# Patient Record
Sex: Male | Born: 1970 | Hispanic: Yes | Smoking: Never smoker
Health system: Southern US, Community
[De-identification: ages and names within clinical notes are randomized; demographics above are authoritative.]

---

## 2017-08-15 ENCOUNTER — Other Ambulatory Visit: Payer: Self-pay | Admitting: Family Medicine

## 2017-08-15 DIAGNOSIS — M542 Cervicalgia: Secondary | ICD-10-CM

## 2017-08-15 DIAGNOSIS — R6884 Jaw pain: Secondary | ICD-10-CM

## 2017-09-03 ENCOUNTER — Other Ambulatory Visit: Payer: Self-pay

## 2017-09-03 ENCOUNTER — Ambulatory Visit
Admission: RE | Admit: 2017-09-03 | Discharge: 2017-09-03 | Disposition: A | Discharge status: Home / Self Care | Payer: 59 | Source: Ambulatory Visit | Attending: Family Medicine | Admitting: Family Medicine

## 2017-09-03 DIAGNOSIS — R6884 Jaw pain: Secondary | ICD-10-CM

## 2017-10-30 ENCOUNTER — Other Ambulatory Visit: Payer: Self-pay | Admitting: Family Medicine

## 2017-10-30 ENCOUNTER — Ambulatory Visit
Admission: RE | Admit: 2017-10-30 | Discharge: 2017-10-30 | Disposition: A | Discharge status: Home / Self Care | Payer: 59 | Source: Ambulatory Visit | Attending: Family Medicine | Admitting: Family Medicine

## 2017-10-30 DIAGNOSIS — R1031 Right lower quadrant pain: Secondary | ICD-10-CM

## 2017-10-30 MED ORDER — IOPAMIDOL (ISOVUE-300) INJECTION 61%
100.0000 mL | Freq: Once | INTRAVENOUS | Status: AC | PRN
  Administered 2017-10-30: 100 mL via INTRAVENOUS

## 2019-05-19 IMAGING — CT CT ABD-PELV W/ CM
1 of 3 series · 13 of 32 positions shown, 19 images · IV contrast (APPLIED)
Comparison: None.

CLINICAL DATA: Right lower quadrant pain and pressure during
urination.

EXAM:
CT ABDOMEN AND PELVIS WITH CONTRAST
TECHNIQUE: Multidetector CT imaging of the abdomen and pelvis was performed
using the standard protocol following bolus administration of
intravenous contrast.
CONTRAST:  100mL [id number] IOPAMIDOL ([id number]) INJECTION 61%

[Series 2: abd/pelvis w/cm · axial · 0.77mm/px · z∈[-460,-40]mm · 13 of 98 slices shown, 19 images]
[im 7/98  soft-tissue]
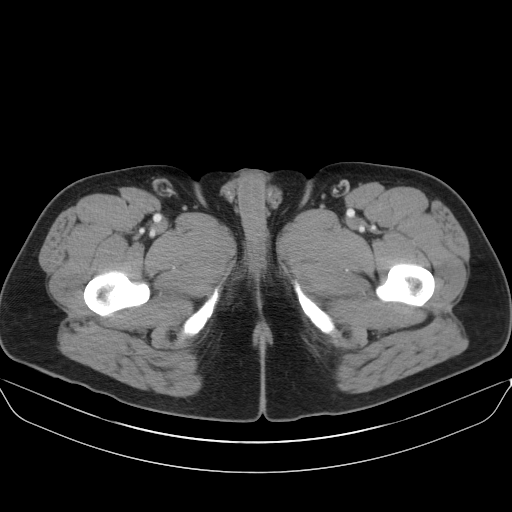
[im 7/98  bone]
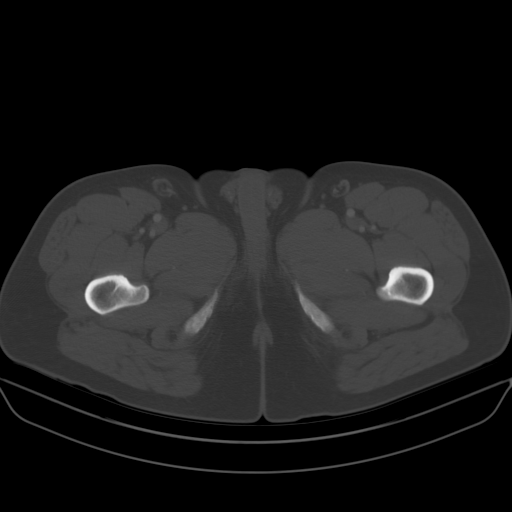
[im 14/98  soft-tissue]
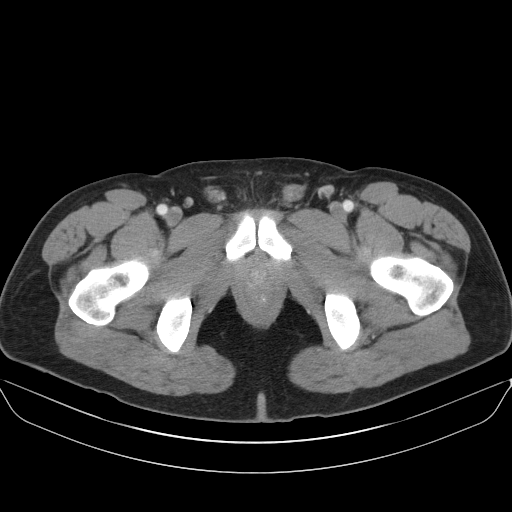
[im 21/98  soft-tissue]
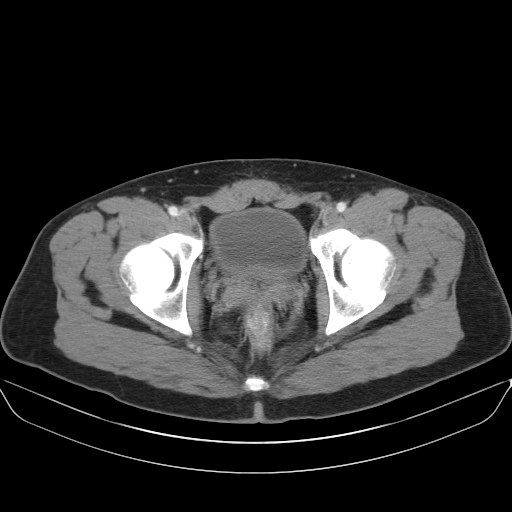
[im 28/98  soft-tissue]
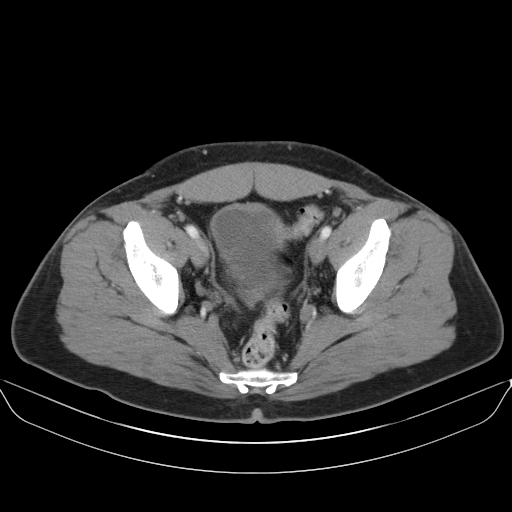
[im 35/98  soft-tissue]
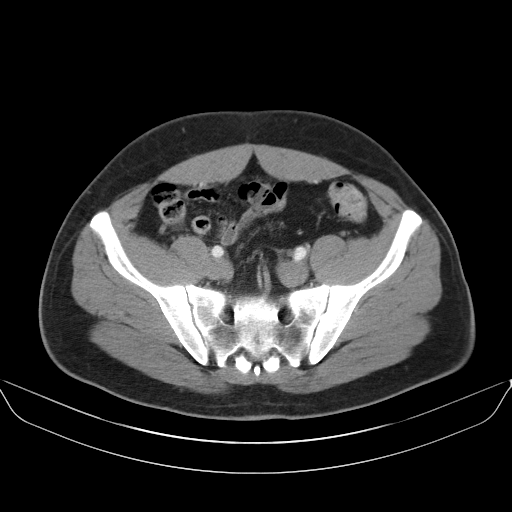
[im 42/98  soft-tissue]
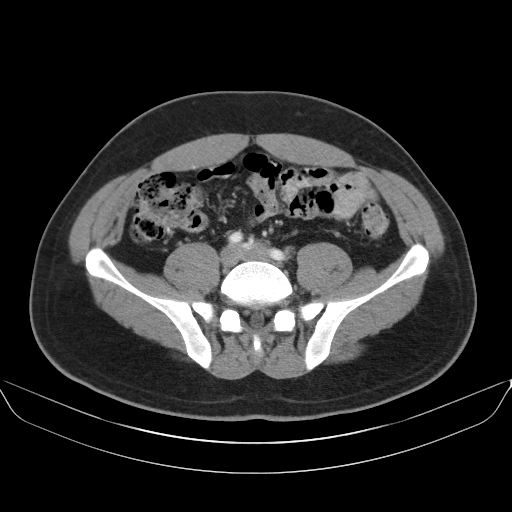
[im 49/98  soft-tissue]
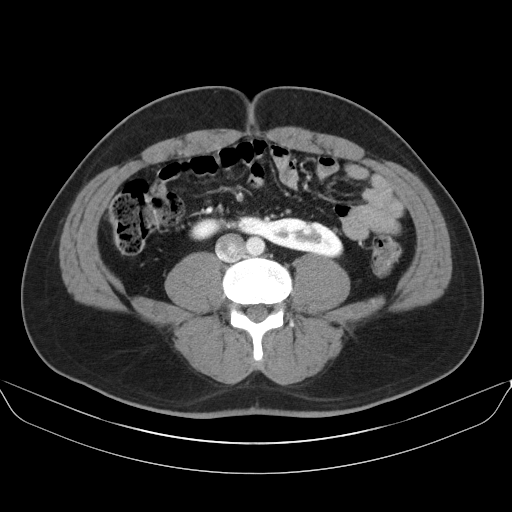
[im 56/98  soft-tissue]
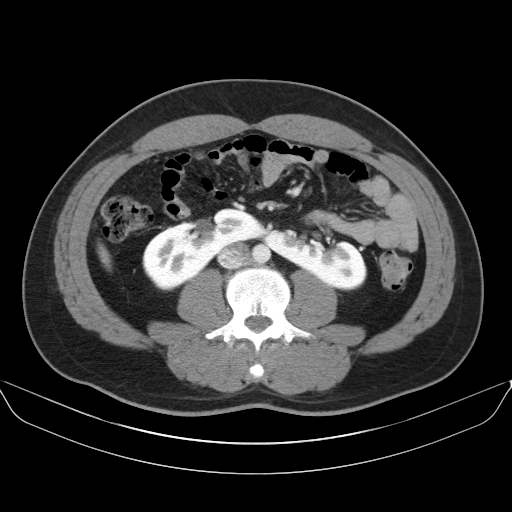
[im 63/98  soft-tissue]
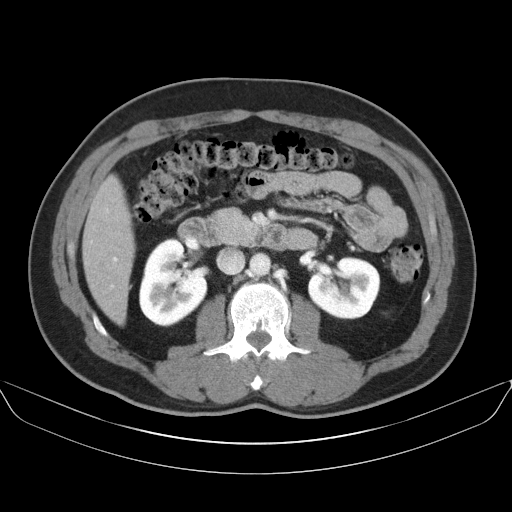
[im 63/98  bone]
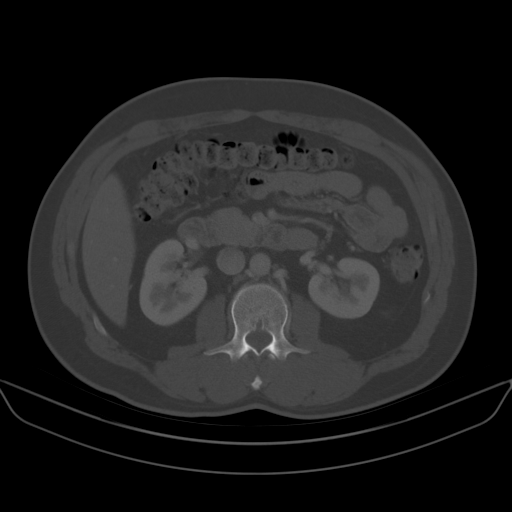
[im 70/98  soft-tissue]
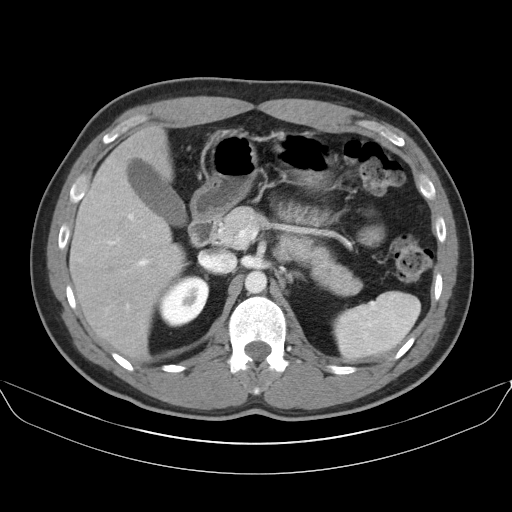
[im 70/98  lung]
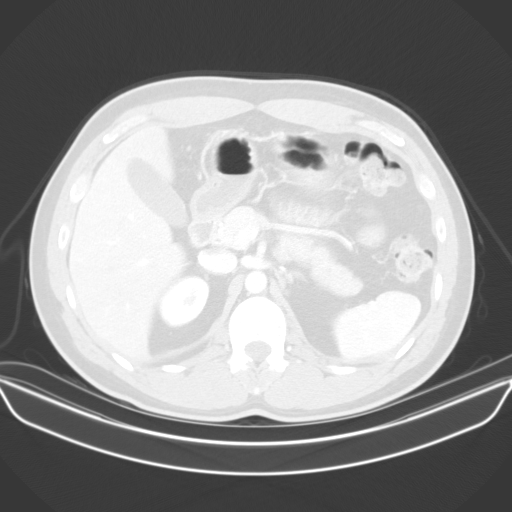
[im 77/98  soft-tissue]
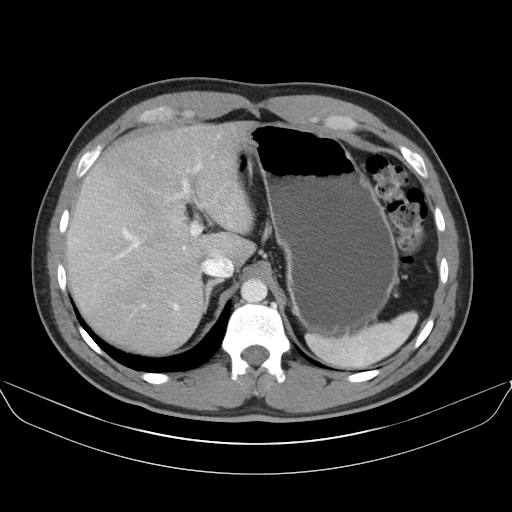
[im 77/98  lung]
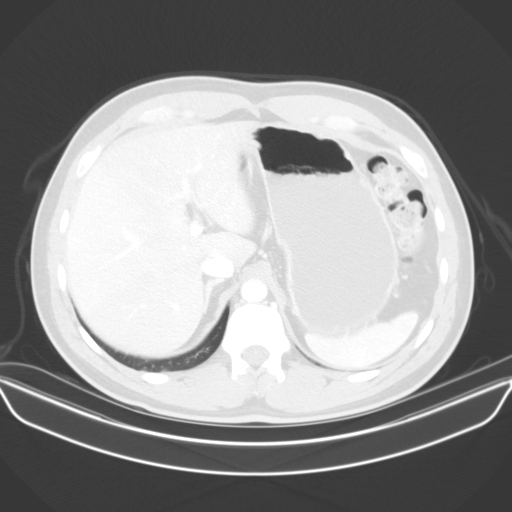
[im 84/98  soft-tissue]
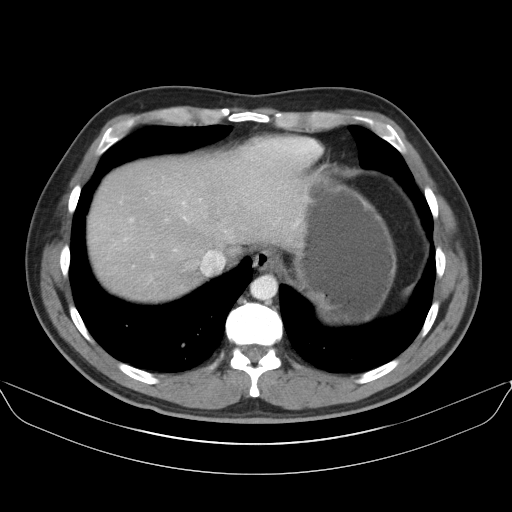
[im 84/98  lung]
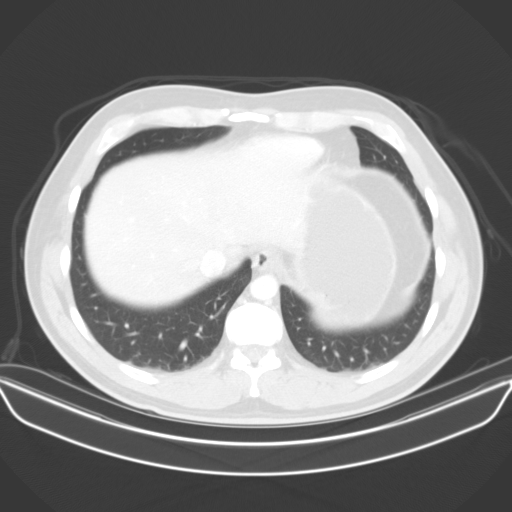
[im 91/98  soft-tissue]
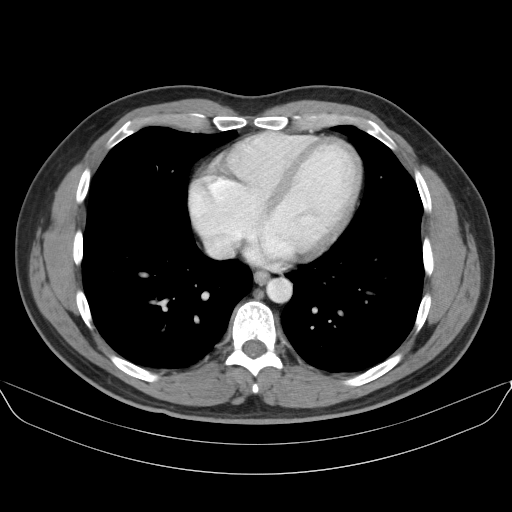
[im 91/98  lung]
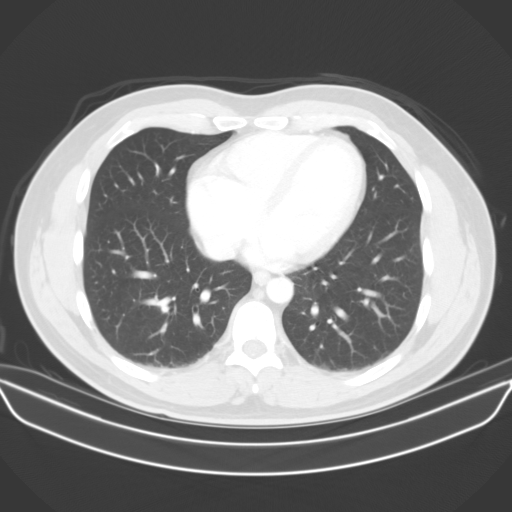

[13 of 32 positions shown; findings below may reference images not displayed]

FINDINGS: Lower chest: No acute abnormality.

Hepatobiliary: No focal liver abnormality is seen. No gallstones,
gallbladder wall thickening, or biliary dilatation.

Pancreas: Unremarkable. No pancreatic ductal dilatation or
surrounding inflammatory changes.

Spleen: Normal in size without focal abnormality.

Adrenals/Urinary Tract: Normal adrenal glands. Horseshoe kidney. No
hydronephrosis or renal masses. Normal appearance of the ureters.
Diffuse mucosal wall thickening of the urinary bladder.

Stomach/Bowel: Stomach is within normal limits. Appendix appears
normal. No evidence of bowel wall thickening, distention, or
inflammatory changes.

Vascular/Lymphatic: Mild aortic atherosclerosis. No enlarged
abdominal or pelvic lymph nodes.

Reproductive: Enlarged heterogeneous prostate gland which contains
coarse calcifications.

Other: No abdominal wall hernia or abnormality. No abdominopelvic
ascites.

Musculoskeletal: No acute or significant osseous findings.
IMPRESSION: Enlarged heterogeneous prostate gland which contains coarse
calcifications. Differential diagnosis includes prostatitis versus
prostate cancer.

Diffuse mucosal thickening of the urinary bladder, which may be
infectious/inflammatory or due to chronic outlet obstruction.

Horseshoe kidney without apparent complications.

## 2020-02-13 ENCOUNTER — Encounter (HOSPITAL_COMMUNITY): Payer: Self-pay | Admitting: *Deleted

## 2020-02-13 ENCOUNTER — Ambulatory Visit (HOSPITAL_COMMUNITY)
Admission: EM | Admit: 2020-02-13 | Discharge: 2020-02-13 | Disposition: A | Discharge status: Home / Self Care | Payer: HRSA Program | Attending: Family Medicine | Admitting: Family Medicine

## 2020-02-13 ENCOUNTER — Other Ambulatory Visit: Payer: Self-pay

## 2020-02-13 ENCOUNTER — Other Ambulatory Visit: Payer: Self-pay | Admitting: Nurse Practitioner

## 2020-02-13 ENCOUNTER — Telehealth (HOSPITAL_COMMUNITY): Payer: Self-pay

## 2020-02-13 DIAGNOSIS — U071 COVID-19: Secondary | ICD-10-CM

## 2020-02-13 DIAGNOSIS — R0602 Shortness of breath: Secondary | ICD-10-CM | POA: Diagnosis not present

## 2020-02-13 MED ORDER — ALBUTEROL SULFATE HFA 108 (90 BASE) MCG/ACT IN AERS
INHALATION_SPRAY | RESPIRATORY_TRACT | Status: AC
  Filled 2020-02-13: qty 6.7

## 2020-02-13 MED ORDER — ALBUTEROL SULFATE HFA 108 (90 BASE) MCG/ACT IN AERS
2.0000 | INHALATION_SPRAY | Freq: Once | RESPIRATORY_TRACT | Status: AC
Start: 2020-02-13 — End: 2020-02-13
  Administered 2020-02-13: 18:00:00 2 via RESPIRATORY_TRACT

## 2020-02-13 MED ORDER — PROMETHAZINE-DM 6.25-15 MG/5ML PO SYRP: 5.0000 mL | ORAL_SOLUTION | Freq: Four times a day (QID) | ORAL | 0 refills | Status: DC | PRN

## 2020-02-13 NOTE — Discharge Instructions (Signed)
Go directly to the ER if your symptoms worsen at any time 

## 2020-02-13 NOTE — Telephone Encounter (Signed)
Called to discuss with patient about Covid symptoms and the use of the monoclonal antibody infusion for those with mild to moderate Covid symptoms and at a high risk of hospitalization.     Pt appears to qualify for this infusion due to co-morbid conditions and/or a member of an at-risk group in accordance with the FDA Emergency Use Authorization.    Risk factors include: BMI over 25, previous smoker,    Symptom onset: 12/11, body aches, chills, loss taste and smell, chest pain, cough.    Tested positive for COVID 19: 12/17 will bring test results.    Discussed information regarding costs of monoclonal antibody treatment, given both CPT & REV codes, and encouraged patient to call their health insurance company to verify cost of treatment that pt will be financially responsible for.  Patient aware they will receive a call from APP for further information and to schedule appointment. All questions answered.   Gave patient information the address to clinic, [address] and phone number to call once patient arrives, [phone].

## 2020-02-13 NOTE — ED Triage Notes (Signed)
Pt reports he tested positive for COVID on dec. 24,2683. Pt went to a drive thur testing site [place] .HE reports productive cough and SHOB. Pt reports increased pain when coughing. His mucous is clear at this time.

## 2020-02-13 NOTE — Progress Notes (Signed)
I connected by phone with Ronald Rocha on 02/13/2020 at 4:26 PM to discuss the potential use of a new treatment for mild to moderate COVID-19 viral infection in non-hospitalized patients.  This patient is a 49 y.o. male that meets the FDA criteria for Emergency Use Authorization of COVID monoclonal antibody casirivimab/imdevimab, bamlanivimab/etesevimab, or sotrovimab.  Has a (+) direct SARS-CoV-2 viral test result  Has mild or moderate COVID-19   Is NOT hospitalized due to COVID-19  Is within 10 days of symptom onset  Has at least one of the high risk factor(s) for progression to severe COVID-19 and/or hospitalization as defined in EUA.  Specific high risk criteria : BMI > 25 and Other high risk medical condition per CDC:  smoker   I have spoken and communicated the following to the patient or parent/caregiver regarding COVID monoclonal antibody treatment:  1. FDA has authorized the emergency use for the treatment of mild to moderate COVID-19 in adults and pediatric patients with positive results of direct SARS-CoV-2 viral testing who are 69 years of age and older weighing at least 40 kg, and who are at high risk for progressing to severe COVID-19 and/or hospitalization.  2. The significant known and potential risks and benefits of COVID monoclonal antibody, and the extent to which such potential risks and benefits are unknown.  3. Information on available alternative treatments and the risks and benefits of those alternatives, including clinical trials.  4. Patients treated with COVID monoclonal antibody should continue to self-isolate and use infection control measures (e.g., wear mask, isolate, social distance, avoid sharing personal items, clean and disinfect "high touch" surfaces, and frequent handwashing) according to CDC guidelines.   5. The patient or parent/caregiver has the option to accept or refuse COVID monoclonal antibody treatment.  After reviewing this information  with the patient, the patient has agreed to receive one of the available covid 19 monoclonal antibodies and will be provided an appropriate fact sheet prior to infusion. Mayra Reel, NP 02/13/2020 4:26 PM

## 2020-02-14 NOTE — ED Provider Notes (Signed)
[facility]    CSN: [id number] Arrival date & time: 02/13/20  1723      History   Chief Complaint Chief Complaint  Patient presents with   Cough    HPI Ronald Rocha is a 49 y.o. male.   Patient presenting today with cough, chest soreness and intermittent episodes of SOB with coughing spells. States he started with mild sick sxs with runny nose, low grade fever several days ago, tested positive for COVID 02/08/20 at a drive up site. Denies sore throat, rash, abdominal pain, N/V/D, hx of smoking or pulmonary dz. So far has not been trying anything OTC for sxs.      History reviewed. No pertinent past medical history.  There are no problems to display for this patient.   History reviewed. No pertinent surgical history.     Home Medications    Prior to Admission medications   Medication Sig Start Date End Date Taking? Authorizing Provider  promethazine-dextromethorphan (PROMETHAZINE-DM) 6.25-15 MG/5ML syrup Take 5 mLs by mouth 4 (four) times daily as needed for cough. 02/13/20   Particia Nearing, PA-C    Family History History reviewed. No pertinent family history.  Social History Social History   Tobacco Use   Smoking status: Never Smoker   Smokeless tobacco: Never Used  Substance Use Topics   Alcohol use: Not Currently   Drug use: Not Currently     Allergies   Patient has no allergy information on record.   Review of Systems Review of Systems PER HPI    Physical Exam Triage Vital Signs ED Triage Vitals  Enc Vitals Group     BP 02/13/20 1749 135/80     Pulse Rate 02/13/20 1749 89     Resp 02/13/20 1749 20     Temp 02/13/20 1749 99.2 F (37.3 C)     Temp Source 02/13/20 1749 Oral     SpO2 02/13/20 1749 96 %     Weight --      Height 02/13/20 1751 5\' 5"  (1.651 m)     Head Circumference --      Peak Flow --      Pain Score 02/13/20 1750 7     Pain Loc --      Pain Edu? --      Excl. in GC? --    No data  found.  Updated Vital Signs BP 135/80 (BP Location: Right Arm)    Pulse 89    Temp 99.2 F (37.3 C) (Oral)    Resp 20    Ht 5\' 5"  (1.651 m)    SpO2 96%   Visual Acuity Right Eye Distance:   Left Eye Distance:   Bilateral Distance:    Right Eye Near:   Left Eye Near:    Bilateral Near:     Physical Exam Vitals and nursing note reviewed.  Constitutional:      Appearance: Normal appearance.  HENT:     Head: Atraumatic.     Right Ear: Tympanic membrane normal.     Left Ear: Tympanic membrane normal.     Nose: Rhinorrhea present.     Mouth/Throat:     Mouth: Mucous membranes are moist.     Pharynx: Oropharynx is clear. Posterior oropharyngeal erythema present.  Eyes:     Extraocular Movements: Extraocular movements intact.     Conjunctiva/sclera: Conjunctivae normal.  Cardiovascular:     Rate and Rhythm: Normal rate and regular rhythm.  Pulmonary:     Effort: Pulmonary  effort is normal. No respiratory distress.     Breath sounds: Normal breath sounds. No wheezing or rales.  Abdominal:     General: Bowel sounds are normal.     Palpations: Abdomen is soft.  Musculoskeletal:        General: Normal range of motion.     Cervical back: Normal range of motion and neck supple.  Skin:    General: Skin is warm and dry.     Findings: No rash.  Neurological:     General: No focal deficit present.     Mental Status: He is oriented to person, place, and time.     Motor: No weakness.  Psychiatric:        Mood and Affect: Mood normal.        Thought Content: Thought content normal.        Judgment: Judgment normal.      UC Treatments / Results  Labs (all labs ordered are listed, but only abnormal results are displayed) Labs Reviewed - No data to display  EKG   Radiology No results found.  Procedures Procedures (including critical care time)  Medications Ordered in UC Medications  albuterol (VENTOLIN HFA) 108 (90 Base) MCG/ACT inhaler 2 puff (2 puffs Inhalation Given  02/13/20 1825)    Initial Impression / Assessment and Plan / UC Course  I have reviewed the triage vital signs and the nursing notes.  Pertinent labs & imaging results that were available during my care of the patient were reviewed by me and considered in my medical decision making (see chart for details).     Afebrile today, O2 saturation at 96% on room air, speaking in full sentences and breathing comfortably during exam with lungs CTAB. Will treat supportively at this point with albuterol inhaler, phenergan DM, and supportive OTC medications and home care. Discussed strict ED precautions if worsening.   Final Clinical Impressions(s) / UC Diagnoses   Final diagnoses:  COVID-19  SOB (shortness of breath)     Discharge Instructions     Go directly to the ER if your symptoms worsen at any time    ED Prescriptions    Medication Sig Dispense Auth. Provider   promethazine-dextromethorphan (PROMETHAZINE-DM) 6.25-15 MG/5ML syrup Take 5 mLs by mouth 4 (four) times daily as needed for cough. 100 mL Particia Nearing, [place]     PDMP not reviewed this encounter.   Roosvelt Maser [place], [place] 02/14/20 [phone]

## 2020-02-15 ENCOUNTER — Ambulatory Visit (HOSPITAL_COMMUNITY)
Admission: RE | Admit: 2020-02-15 | Discharge: 2020-02-15 | Disposition: A | Discharge status: Home / Self Care | Payer: 59 | Source: Ambulatory Visit | Attending: Pulmonary Disease | Admitting: Pulmonary Disease

## 2020-02-15 DIAGNOSIS — U071 COVID-19: Secondary | ICD-10-CM | POA: Insufficient documentation

## 2020-02-15 MED ORDER — METHYLPREDNISOLONE SODIUM SUCC 125 MG IJ SOLR: 125.0000 mg | Freq: Once | INTRAMUSCULAR | Status: DC | PRN

## 2020-02-15 MED ORDER — DIPHENHYDRAMINE HCL 50 MG/ML IJ SOLN: 50.0000 mg | Freq: Once | INTRAMUSCULAR | Status: DC | PRN

## 2020-02-15 MED ORDER — FAMOTIDINE IN NACL 20-0.9 MG/50ML-% IV SOLN: 20.0000 mg | Freq: Once | INTRAVENOUS | Status: DC | PRN

## 2020-02-15 MED ORDER — ALBUTEROL SULFATE HFA 108 (90 BASE) MCG/ACT IN AERS: 2.0000 | INHALATION_SPRAY | Freq: Once | RESPIRATORY_TRACT | Status: DC | PRN

## 2020-02-15 MED ORDER — SODIUM CHLORIDE 0.9 % IV SOLN: Freq: Once | INTRAVENOUS | Status: AC

## 2020-02-15 MED ORDER — EPINEPHRINE 0.3 MG/0.3ML IJ SOAJ: 0.3000 mg | Freq: Once | INTRAMUSCULAR | Status: DC | PRN

## 2020-02-15 MED ORDER — SODIUM CHLORIDE 0.9 % IV SOLN: INTRAVENOUS | Status: DC | PRN

## 2020-02-15 NOTE — Progress Notes (Signed)
Patient reviewed Fact Sheet for Patients, Parents, and Caregivers for Emergency Use Authorization (EUA) of bamlanivimab and etesevimab for the Treatment of Coronavirus. Patient also reviewed and is agreeable to the estimated cost of treatment. Patient is agreeable to proceed.   

## 2020-02-15 NOTE — Discharge Instructions (Signed)
10 Things You Can Do to Manage Your COVID-19 Symptoms at Home If you have possible or confirmed COVID-19: 1. Stay home from work and school. And stay away from other public places. If you must go out, avoid using any kind of public transportation, ridesharing, or taxis. 2. Monitor your symptoms carefully. If your symptoms get worse, call your healthcare provider immediately. 3. Get rest and stay hydrated. 4. If you have a medical appointment, call the healthcare provider ahead of time and tell them that you have or may have COVID-19. 5. For medical emergencies, call 911 and notify the dispatch personnel that you have or may have COVID-19. 6. Cover your cough and sneezes with a tissue or use the inside of your elbow. 7. Wash your hands often with soap and water for at least 20 seconds or clean your hands with an alcohol-based hand sanitizer that contains at least 60% alcohol. 8. As much as possible, stay in a specific room and away from other people in your home. Also, you should use a separate bathroom, if available. If you need to be around other people in or outside of the home, wear a mask. 9. Avoid sharing personal items with other people in your household, like dishes, towels, and bedding. 10. Clean all surfaces that are touched often, like counters, tabletops, and doorknobs. Use household cleaning sprays or wipes according to the label instructions. cdc.gov/coronavirus 08/27/2018 This information is not intended to replace advice given to you by your health care provider. Make sure you discuss any questions you have with your health care provider. Document Revised: 01/29/2019 Document Reviewed: 01/29/2019 Elsevier Patient Education  2020 Elsevier Inc. What types of side effects do monoclonal antibody drugs cause?  Common side effects  In general, the more common side effects caused by monoclonal antibody drugs include: . Allergic reactions, such as hives or itching . Flu-like signs and  symptoms, including chills, fatigue, fever, and muscle aches and pains . Nausea, vomiting . Diarrhea . Skin rashes . Low blood pressure   The CDC is recommending patients who receive monoclonal antibody treatments wait at least 90 days before being vaccinated.  Currently, there are no data on the safety and efficacy of mRNA COVID-19 vaccines in persons who received monoclonal antibodies or convalescent plasma as part of COVID-19 treatment. Based on the estimated half-life of such therapies as well as evidence suggesting that reinfection is uncommon in the 90 days after initial infection, vaccination should be deferred for at least 90 days, as a precautionary measure until additional information becomes available, to avoid interference of the antibody treatment with vaccine-induced immune responses. If you have any questions or concerns after the infusion please call the Advanced Practice Provider on call at [phone]. This number is ONLY intended for your use regarding questions or concerns about the infusion post-treatment side-effects.  Please do not provide this number to others for use. For return to work notes please contact your primary care provider.   If someone you know is interested in receiving treatment please have them call the COVID hotline at [phone].   

## 2020-02-15 NOTE — Progress Notes (Signed)
  Diagnosis: COVID-19  Physician: Dr. Shan Levans  Procedure: Covid Infusion Clinic Med: bamlanivimab\etesevimab infusion - Provided patient with bamlanimivab\etesevimab fact sheet for patients, parents and caregivers prior to infusion.  Complications: No immediate complications noted.  Discharge: Discharged home   Essie Hart 02/15/2020

## 2020-09-30 ENCOUNTER — Other Ambulatory Visit: Payer: Self-pay

## 2020-09-30 ENCOUNTER — Ambulatory Visit (INDEPENDENT_AMBULATORY_CARE_PROVIDER_SITE_OTHER): Payer: Self-pay

## 2020-09-30 ENCOUNTER — Ambulatory Visit (HOSPITAL_COMMUNITY)
Admission: EM | Admit: 2020-09-30 | Discharge: 2020-09-30 | Disposition: A | Discharge status: Home / Self Care | Payer: Self-pay | Attending: Emergency Medicine | Admitting: Emergency Medicine

## 2020-09-30 ENCOUNTER — Encounter (HOSPITAL_COMMUNITY): Payer: Self-pay

## 2020-09-30 DIAGNOSIS — R042 Hemoptysis: Secondary | ICD-10-CM

## 2020-09-30 DIAGNOSIS — M62830 Muscle spasm of back: Secondary | ICD-10-CM

## 2020-09-30 NOTE — ED Triage Notes (Signed)
Pt present coughing with back pain, symptoms started last night for the coughing.  The back pain started a month ago. Pt state he coughed up a blood clot this am.

## 2020-09-30 NOTE — ED Provider Notes (Signed)
[facility]    CSN: [id number] Arrival date & time: 09/30/20  1726      History   Chief Complaint Chief Complaint  Patient presents with   Cough   Back Pain    HPI Ronald Rocha is a 50 y.o. male.   Patient here for evaluation of back pain and coughing up blood.  Reports back pain has been ongoing for the past few weeks but coughed up blood this am.  Denies any congestion or fevers.  Reports back pain is intermittent and sharp.  Denies any trauma, injury, or other precipitating event.  Denies any specific alleviating or aggravating factors.  Denies any fevers, chest pain, shortness of breath, N/V/D, numbness, tingling, weakness, abdominal pain, or headaches.    The history is provided by the patient.  Cough Back Pain  History reviewed. No pertinent past medical history.  There are no problems to display for this patient.   History reviewed. No pertinent surgical history.     Home Medications    Prior to Admission medications   Medication Sig Start Date End Date Taking? Authorizing Provider  promethazine-dextromethorphan (PROMETHAZINE-DM) 6.25-15 MG/5ML syrup Take 5 mLs by mouth 4 (four) times daily as needed for cough. 02/13/20   Particia Nearing, PA-C    Family History History reviewed. No pertinent family history.  Social History Social History   Tobacco Use   Smoking status: Never   Smokeless tobacco: Never  Substance Use Topics   Alcohol use: Not Currently   Drug use: Not Currently     Allergies   Patient has no known allergies.   Review of Systems Review of Systems  Respiratory:  Positive for cough.   Musculoskeletal:  Positive for back pain.  All other systems reviewed and are negative.   Physical Exam Triage Vital Signs ED Triage Vitals  Enc Vitals Group     BP 09/30/20 1743 (!) 148/75     Pulse Rate 09/30/20 1743 84     Resp 09/30/20 1743 16     Temp 09/30/20 1743 98.9 F (37.2 C)     Temp Source 09/30/20 1743  Oral     SpO2 09/30/20 1743 95 %     Weight --      Height --      Head Circumference --      Peak Flow --      Pain Score 09/30/20 1741 5     Pain Loc --      Pain Edu? --      Excl. in GC? --    No data found.  Updated Vital Signs BP (!) 148/75 (BP Location: Left Arm)   Pulse 84   Temp 98.9 F (37.2 C) (Oral)   Resp 16   SpO2 95%   Visual Acuity Right Eye Distance:   Left Eye Distance:   Bilateral Distance:    Right Eye Near:   Left Eye Near:    Bilateral Near:     Physical Exam Vitals and nursing note reviewed.  Constitutional:      General: He is not in acute distress.    Appearance: Normal appearance. He is not ill-appearing, toxic-appearing or diaphoretic.  HENT:     Head: Normocephalic and atraumatic.  Eyes:     Conjunctiva/sclera: Conjunctivae normal.  Cardiovascular:     Rate and Rhythm: Normal rate.     Pulses: Normal pulses.     Heart sounds: Normal heart sounds.  Pulmonary:     Effort: Pulmonary  effort is normal.     Breath sounds: Normal breath sounds.  Abdominal:     General: Abdomen is flat.  Musculoskeletal:        General: Normal range of motion.     Cervical back: Normal range of motion. No rigidity, tenderness or bony tenderness. No pain with movement. Normal range of motion.     Thoracic back: No signs of trauma, tenderness or bony tenderness. Normal range of motion.     Lumbar back: No tenderness or bony tenderness. Normal range of motion. Negative right straight leg raise test and negative left straight leg raise test.  Skin:    General: Skin is warm and dry.  Neurological:     General: No focal deficit present.     Mental Status: He is alert and oriented to person, place, and time.  Psychiatric:        Mood and Affect: Mood normal.     UC Treatments / Results  Labs (all labs ordered are listed, but only abnormal results are displayed) Labs Reviewed - No data to display  EKG   Radiology DG Chest 2 View  Result Date:  09/30/2020 CLINICAL DATA:  Coughing up blood EXAM: CHEST - 2 VIEW COMPARISON:  None. FINDINGS: The heart size and mediastinal contours are within normal limits. Both lungs are clear. The visualized skeletal structures are unremarkable. IMPRESSION: No active cardiopulmonary disease. Electronically Signed   By: Jasmine Pang M.D.   On: 09/30/2020 18:19    Procedures Procedures (including critical care time)  Medications Ordered in UC Medications - No data to display  Initial Impression / Assessment and Plan / UC Course  I have reviewed the triage vital signs and the nursing notes.  Pertinent labs & imaging results that were available during my care of the patient were reviewed by me and considered in my medical decision making (see chart for details).    Assessment negative for red flags or concerns.  Xray negative for active cardiopulmonary disease.  Likely back pain due to muscle spasms.  Discussed conservative symptom management including OTC pain medications and heat/ice.  Encouraged fluids and rest.  Follow up as needed.   Final Clinical Impressions(s) / UC Diagnoses   Final diagnoses:  Muscle spasm of back     Discharge Instructions      You can take Ibuprofen and/or Tylenol as needed for pain relief and fever reduction.    Use heat, ice, or alternate between heat and ice for comfort.  You can use Icy/Hot, lidocaine patches, or BioFreeze as needed for pain relief.    Make sure you drink plenty of fluids, especially water, and rest.    Return or go to the Emergency Department if symptoms worsen or do not improve in the next few days.     ED Prescriptions   None    PDMP not reviewed this encounter.   Ivette Loyal, NP 09/30/20 [phone]

## 2020-09-30 NOTE — Discharge Instructions (Addendum)
You can take Ibuprofen and/or Tylenol as needed for pain relief and fever reduction.    Use heat, ice, or alternate between heat and ice for comfort.  You can use Icy/Hot, lidocaine patches, or BioFreeze as needed for pain relief.    Make sure you drink plenty of fluids, especially water, and rest.    Return or go to the Emergency Department if symptoms worsen or do not improve in the next few days.

## 2021-04-25 ENCOUNTER — Ambulatory Visit (HOSPITAL_COMMUNITY)
Admission: EM | Admit: 2021-04-25 | Discharge: 2021-04-25 | Disposition: A | Discharge status: Home / Self Care | Payer: Self-pay | Attending: Family Medicine | Admitting: Family Medicine

## 2021-04-25 ENCOUNTER — Encounter (HOSPITAL_COMMUNITY): Payer: Self-pay | Admitting: Emergency Medicine

## 2021-04-25 ENCOUNTER — Other Ambulatory Visit: Payer: Self-pay

## 2021-04-25 DIAGNOSIS — H6691 Otitis media, unspecified, right ear: Secondary | ICD-10-CM

## 2021-04-25 DIAGNOSIS — H6690 Otitis media, unspecified, unspecified ear: Secondary | ICD-10-CM

## 2021-04-25 MED ORDER — KETOROLAC TROMETHAMINE 30 MG/ML IJ SOLN
INTRAMUSCULAR | Status: AC
  Filled 2021-04-25: qty 1

## 2021-04-25 MED ORDER — AMOXICILLIN-POT CLAVULANATE 875-125 MG PO TABS: 1.0000 | ORAL_TABLET | Freq: Two times a day (BID) | ORAL | 0 refills | Status: AC

## 2021-04-25 MED ORDER — NEOMYCIN-POLYMYXIN-HC 3.5-10000-1 OT SOLN: 3.0000 [drp] | Freq: Four times a day (QID) | OTIC | 0 refills | Status: DC

## 2021-04-25 MED ORDER — KETOROLAC TROMETHAMINE 30 MG/ML IJ SOLN
30.0000 mg | Freq: Once | INTRAMUSCULAR | Status: AC
  Administered 2021-04-25: 30 mg via INTRAMUSCULAR

## 2021-04-25 MED ORDER — IBUPROFEN 800 MG PO TABS: 800.0000 mg | ORAL_TABLET | Freq: Three times a day (TID) | ORAL | 0 refills | Status: AC | PRN

## 2021-04-25 NOTE — Discharge Instructions (Addendum)
You have been given a shot of Toradol 30 mg for pain here today(Le hemos dado una inyeccion de Toradol para dolor hoy)  Take amoxicillin-clavulanic 875 mg, 1 tablet twice daily with food for 10 days. (Tome amoxicillin/clavulanate 875 mg, 1 tableta 2 veces al dia por 10 dias)   Put 2 to 3 drops of Cortisporin eardrops in your right ear 4 times daily for 7 days.(Ponga las gotas en el oido 4 veces al dia por 7 dias.)  Go see ENT if your symptoms are not resolving(Habla para hacer cita con otolaryngologo si no esta mejorando)  If your headache is worse, go to the [employer] de la cabeza esta empeorando, vayase a la sala de [place])

## 2021-04-25 NOTE — ED Provider Notes (Addendum)
[facility]    CSN: [id number] Arrival date & time: 04/25/21  1536      History   Chief Complaint Chief Complaint  Patient presents with   Headache   Otalgia    HPI Ronald Rocha is a 51 y.o. male.    Headache Associated symptoms: ear pain   Otalgia Associated symptoms: headaches   Here for headache and bilateral ear pain.  3 weeks ago he had some dizziness that improved, but then the next day he had headache start.  He also in that time is developed bilateral ear pain.  He put a Q-tip to the external end of the ear canal on the right and noted blood on it.  He had fever at the very first, but no longer has.  No cough or cold symptoms.  He did vomit the first day but has not since then.  No history of migraines noted  History reviewed. No pertinent past medical history.  There are no problems to display for this patient.   History reviewed. No pertinent surgical history.     Home Medications    Prior to Admission medications   Medication Sig Start Date End Date Taking? Authorizing Provider  amoxicillin-clavulanate (AUGMENTIN) 875-125 MG tablet Take 1 tablet by mouth 2 (two) times daily for 10 days. 04/25/21 05/05/21 Yes Zenia Resides, MD  ibuprofen (ADVIL) 800 MG tablet Take 1 tablet (800 mg total) by mouth every 8 (eight) hours as needed (pain). 04/25/21  Yes Jonnathan Birman, Janace Aris, MD  neomycin-polymyxin-hydrocortisone (CORTISPORIN) OTIC solution Place 3 drops into the right ear 4 (four) times daily. 04/25/21  Yes Zenia Resides, MD    Family History History reviewed. No pertinent family history.  Social History Social History   Tobacco Use   Smoking status: Never   Smokeless tobacco: Never  Substance Use Topics   Alcohol use: Not Currently   Drug use: Not Currently     Allergies   Patient has no known allergies.   Review of Systems Review of Systems  HENT:  Positive for ear pain.   Neurological:  Positive for headaches.     Physical Exam Triage Vital Signs ED Triage Vitals  Enc Vitals Group     BP 04/25/21 1603 128/67     Pulse Rate 04/25/21 1603 86     Resp 04/25/21 1603 18     Temp 04/25/21 1603 98 F (36.7 C)     Temp Source 04/25/21 1603 Oral     SpO2 04/25/21 1603 99 %     Weight --      Height 04/25/21 1602 5\' 5"  (1.651 m)     Head Circumference --      Peak Flow --      Pain Score 04/25/21 1602 7     Pain Loc --      Pain Edu? --      Excl. in [place]? --    No data found.  Updated Vital Signs BP 128/67 (BP Location: Left Arm)    Pulse 86    Temp 98 F (36.7 C) (Oral)    Resp 18    Ht 5\' 5"  (1.651 m)    SpO2 99%   Visual Acuity Right Eye Distance:   Left Eye Distance:   Bilateral Distance:    Right Eye Near:   Left Eye Near:    Bilateral Near:     Physical Exam Constitutional:      General: He is not in acute distress.  Appearance: He is not toxic-appearing.  HENT:     Left Ear: Tympanic membrane and ear canal normal.     Ears:     Comments: Right tympanic membrane is erythematous, dull, and with abnormal morphology and possibly has a perforation and it no discharge in the canal currently    Nose: Nose normal.     Mouth/Throat:     Mouth: Mucous membranes are moist.     Pharynx: No oropharyngeal exudate or posterior oropharyngeal erythema.  Eyes:     Extraocular Movements: Extraocular movements intact.     Conjunctiva/sclera: Conjunctivae normal.     Pupils: Pupils are equal, round, and reactive to light.  Cardiovascular:     Rate and Rhythm: Normal rate and regular rhythm.     Heart sounds: No murmur heard. Pulmonary:     Effort: Pulmonary effort is normal.     Breath sounds: Normal breath sounds.  Musculoskeletal:     Cervical back: Neck supple.  Lymphadenopathy:     Cervical: No cervical adenopathy.  Skin:    Capillary Refill: Capillary refill takes less than 2 seconds.     Coloration: Skin is not jaundiced or pale.  Neurological:     General: No focal  deficit present.     Mental Status: He is alert and oriented to person, place, and time.     Cranial Nerves: No cranial nerve deficit.     Sensory: No sensory deficit.     Motor: No weakness.  Psychiatric:        Behavior: Behavior normal.     UC Treatments / Results  Labs (all labs ordered are listed, but only abnormal results are displayed) Labs Reviewed - No data to display  EKG   Radiology No results found.  Procedures Procedures (including critical care time)  Medications Ordered in UC Medications  ketorolac (TORADOL) 30 MG/ML injection 30 mg (has no administration in time range)    Initial Impression / Assessment and Plan / UC Course  I have reviewed the triage vital signs and the nursing notes.  Pertinent labs & imaging results that were available during my care of the patient were reviewed by me and considered in my medical decision making (see chart for details).     We will treat the otitis media with oral antibiotics and with drops since there is a possible [place].  Contact information given on ENT for if he is not improving.  Also if he has worsening of the headache, he is to proceed to the emergency room for possible advanced imaging Final Clinical Impressions(s) / UC Diagnoses   Final diagnoses:  Acute otitis media, unspecified otitis media type     Discharge Instructions      You have been given a shot of Toradol 30 mg for pain here today(Le hemos dado una inyeccion de Toradol para dolor hoy)  Take amoxicillin-clavulanic 875 mg, 1 tablet twice daily with food for 10 days. ([place] amoxicillin/clavulanate 875 mg, 1 tableta 2 veces al dia por 10 dias)   Put 2 to 3 drops of Cortisporin eardrops in your right ear 4 times daily for 7 days.(Ponga las gotas en el oido 4 veces al dia por 7 dias.)  Go see ENT if your symptoms are not resolving(Habla para hacer cita con otolaryngologo si no esta mejorando)  If your headache is worse, go to the [employer]  de la cabeza esta empeorando, vayase a la sala de [place])     ED Prescriptions  Medication Sig Dispense Auth. Provider   amoxicillin-clavulanate (AUGMENTIN) 875-125 MG tablet Take 1 tablet by mouth 2 (two) times daily for 10 days. 20 tablet Zenia Resides, MD   ibuprofen (ADVIL) 800 MG tablet Take 1 tablet (800 mg total) by mouth every 8 (eight) hours as needed (pain). 21 tablet Virginia Francisco, Janace Aris, MD   neomycin-polymyxin-hydrocortisone (CORTISPORIN) OTIC solution Place 3 drops into the right ear 4 (four) times daily. 10 mL Zenia Resides, MD      PDMP not reviewed this encounter.   Zenia Resides, MD 04/25/21 1624    Zenia Resides, MD 04/25/21 [phone]

## 2021-04-25 NOTE — ED Triage Notes (Signed)
Pt reports bilateral ear pain and severe headache x 3 weeks. Pt states when cleaning his ears with a q-tip there was blood.

## 2022-04-17 ENCOUNTER — Encounter (HOSPITAL_COMMUNITY): Payer: Self-pay

## 2022-04-17 ENCOUNTER — Ambulatory Visit (INDEPENDENT_AMBULATORY_CARE_PROVIDER_SITE_OTHER): Payer: Self-pay

## 2022-04-17 ENCOUNTER — Ambulatory Visit (HOSPITAL_COMMUNITY)
Admission: EM | Admit: 2022-04-17 | Discharge: 2022-04-17 | Disposition: A | Discharge status: Home / Self Care | Payer: Self-pay | Attending: Family Medicine | Admitting: Family Medicine

## 2022-04-17 DIAGNOSIS — M20011 Mallet finger of right finger(s): Secondary | ICD-10-CM

## 2022-04-17 DIAGNOSIS — M79644 Pain in right finger(s): Secondary | ICD-10-CM

## 2022-04-17 DIAGNOSIS — S6991XA Unspecified injury of right wrist, hand and finger(s), initial encounter: Secondary | ICD-10-CM

## 2022-04-17 NOTE — Discharge Instructions (Addendum)
You were seen today for finger injury.  The xray does not show any fracture.  There is likely a tendon injury.   We have placed you in a splint.  Please wear at all times if possible.  You should follow up with the orthopedic hand specialist.  Please call [facility] at [phone] to make an appointment.

## 2022-04-17 NOTE — ED Triage Notes (Addendum)
Patient states he was playing with his son and tried to grab his shirt and the right pinky popped approx 6 days ago. Patient has a deformity and swelling to the end of the finger.  Patient denies taking any medications.

## 2022-04-17 NOTE — ED Provider Notes (Signed)
[facility]    CSN: [id number] Arrival date & time: 04/17/22  [id number]      History   Chief Complaint Chief Complaint  Patient presents with   Finger Injury    HPI Ronald Rocha is a 52 y.o. male.   Patient is here for right 5th finger injury.  About 1 week ago he was playing with is son.  His finger got stuck on his son's shirt.  He had immediate pain and swelling.  He continues with this now.  He is unable to extend the distal finger.        History reviewed. No pertinent past medical history.  There are no problems to display for this patient.   History reviewed. No pertinent surgical history.     Home Medications    Prior to Admission medications   Medication Sig Start Date End Date Taking? Authorizing Provider  ibuprofen (ADVIL) 800 MG tablet Take 1 tablet (800 mg total) by mouth every 8 (eight) hours as needed (pain). 04/25/21   Barrett Henle, MD  neomycin-polymyxin-hydrocortisone (CORTISPORIN) OTIC solution Place 3 drops into the right ear 4 (four) times daily. 04/25/21   Barrett Henle, MD    Family History Family History  Problem Relation Age of Onset   Heart failure Mother     Social History Social History   Tobacco Use   Smoking status: Never   Smokeless tobacco: Never  Vaping Use   Vaping Use: Never used  Substance Use Topics   Alcohol use: Not Currently   Drug use: Never     Allergies   Patient has no known allergies.   Review of Systems Review of Systems  Constitutional: Negative.   HENT: Negative.    Respiratory: Negative.    Cardiovascular: Negative.   Gastrointestinal: Negative.   Musculoskeletal:  Positive for joint swelling.     Physical Exam Triage Vital Signs ED Triage Vitals  Enc Vitals Group     BP 04/17/22 1203 (!) 150/77     Pulse Rate 04/17/22 1203 74     Resp 04/17/22 1203 16     Temp 04/17/22 1203 98.2 F (36.8 C)     Temp Source 04/17/22 1203 Oral     SpO2 04/17/22 1203 95 %      Weight --      Height --      Head Circumference --      Peak Flow --      Pain Score 04/17/22 1204 7     Pain Loc --      Pain Edu? --      Excl. in [place]? --    No data found.  Updated Vital Signs BP (!) 150/77 (BP Location: Left Arm)   Pulse 74   Temp 98.2 F (36.8 C) (Oral)   Resp 16   SpO2 95%   Visual Acuity Right Eye Distance:   Left Eye Distance:   Bilateral Distance:    Right Eye Near:   Left Eye Near:    Bilateral Near:     Physical Exam Constitutional:      Appearance: Normal appearance.  Musculoskeletal:     Comments: There is swelling and slight TTP to the right 5th DIP joint;  the distal finger stays flexed.  He is unable to extend the distal 5th finger.   Skin:    General: Skin is warm.  Neurological:     General: No focal deficit present.     Mental Status: He  is alert.  Psychiatric:        Mood and Affect: Mood normal.     UC Treatments / Results  Labs (all labs ordered are listed, but only abnormal results are displayed) Labs Reviewed - No data to display  EKG   Radiology DG Finger Little Right  Result Date: 04/17/2022 CLINICAL DATA:  Injury to RIGHT little finger 6 days ago, has pain, deformity and swelling EXAM: RIGHT LITTLE FINGER 2+V COMPARISON:  None Available. FINDINGS: Osseous mineralization normal. Joint spaces preserved. Flexion deformity at DIP joint with slight hyperextension at PIP joint. No acute fracture, dislocation, or bone destruction. Dorsal soft tissue swelling overlying the IP joint. IMPRESSION: No fracture or dislocation identified. Flexion deformity at DIP joint with overlying dorsal soft tissue swelling, cannot exclude extensor mechanism injury. Electronically Signed   By: Lavonia Dana M.D.   On: 04/17/2022 12:42    Procedures Procedures (including critical care time)  Medications Ordered in UC Medications - No data to display  Initial Impression / Assessment and Plan / UC Course  I have reviewed the triage vital  signs and the nursing notes.  Pertinent labs & imaging results that were available during my care of the patient were reviewed by me and considered in my medical decision making (see chart for details).    Final Clinical Impressions(s) / UC Diagnoses   Final diagnoses:  Injury of finger of right hand, initial encounter  Mallet finger of right hand     Discharge Instructions      You were seen today for finger injury.  The xray does not show any fracture.  There is likely a tendon injury.   We have placed you in a splint.  Please wear at all times if possible.  You should follow up with the orthopedic hand specialist.  Please call [facility] at [phone] to make an appointment.     ED Prescriptions   None    PDMP not reviewed this encounter.   [name], MD 04/17/22 1254

## 2022-04-19 IMAGING — DX DG CHEST 2V
2 series · 2 of 2 positions shown · non-contrast
Comparison: None.

CLINICAL DATA: Coughing up blood

EXAM:
CHEST - 2 VIEW

[chest pa]
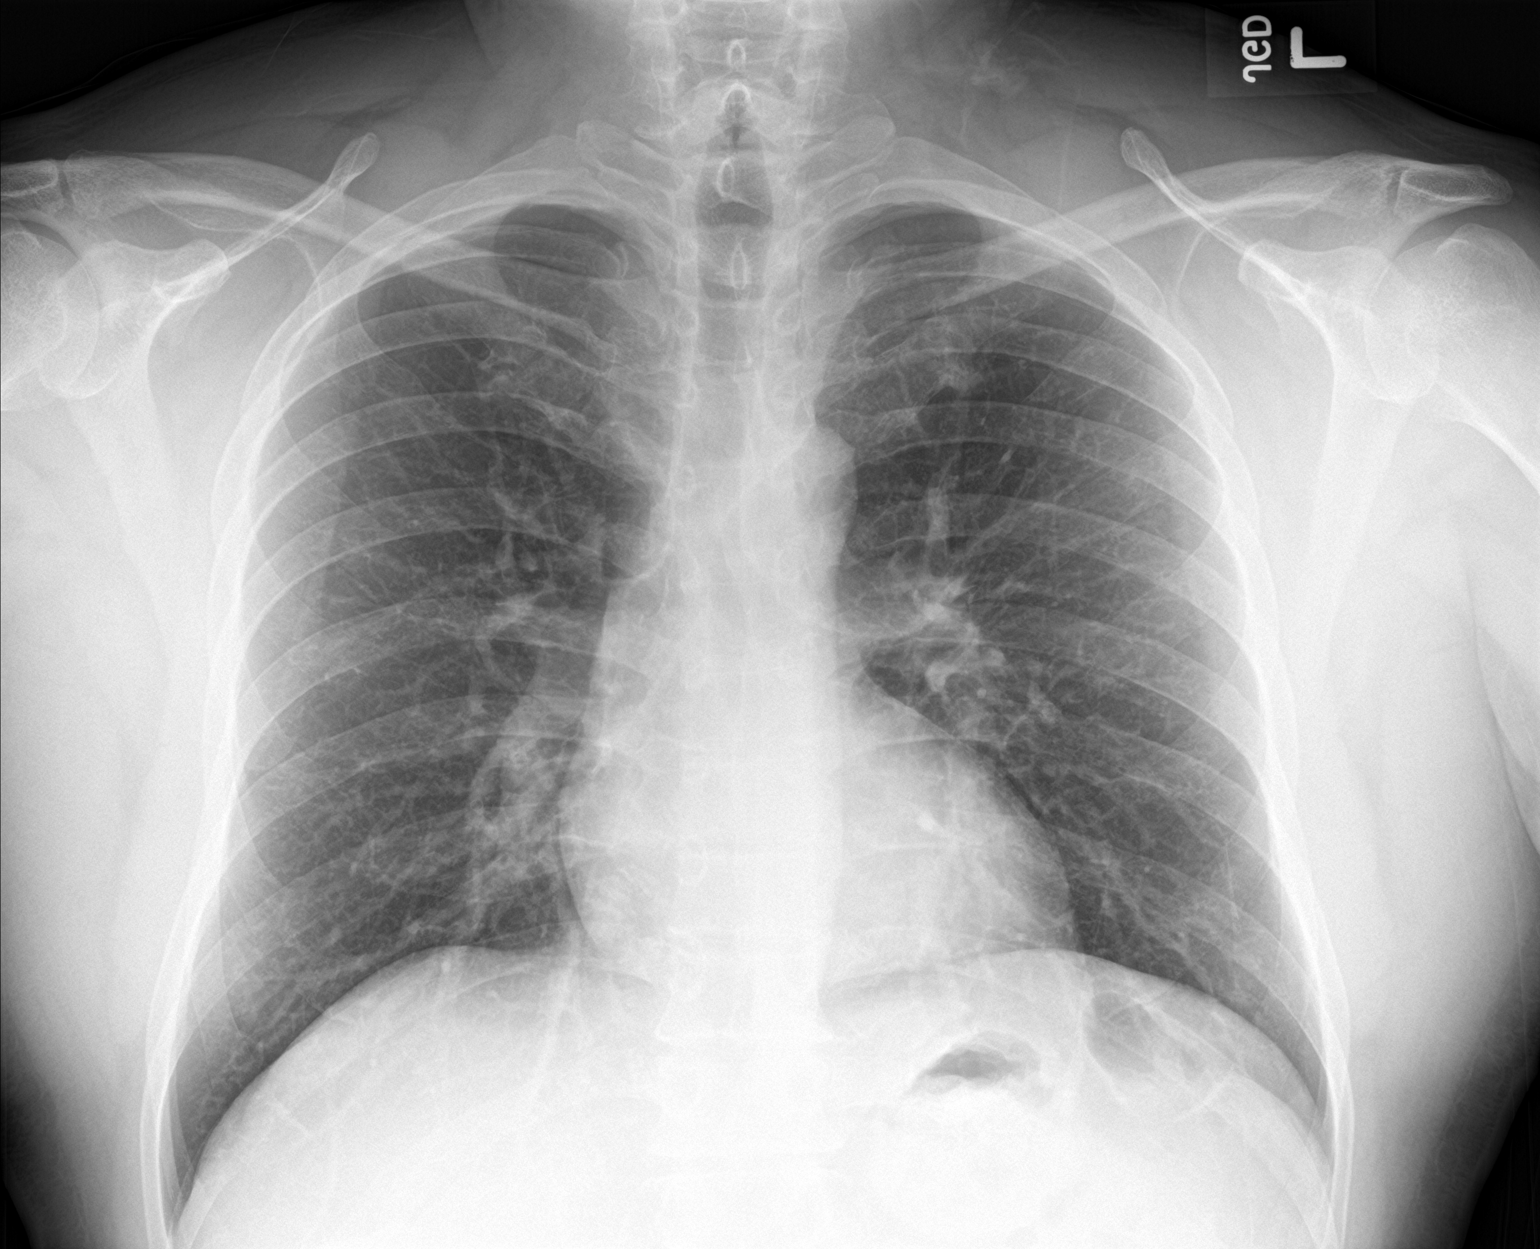

[chest lat]
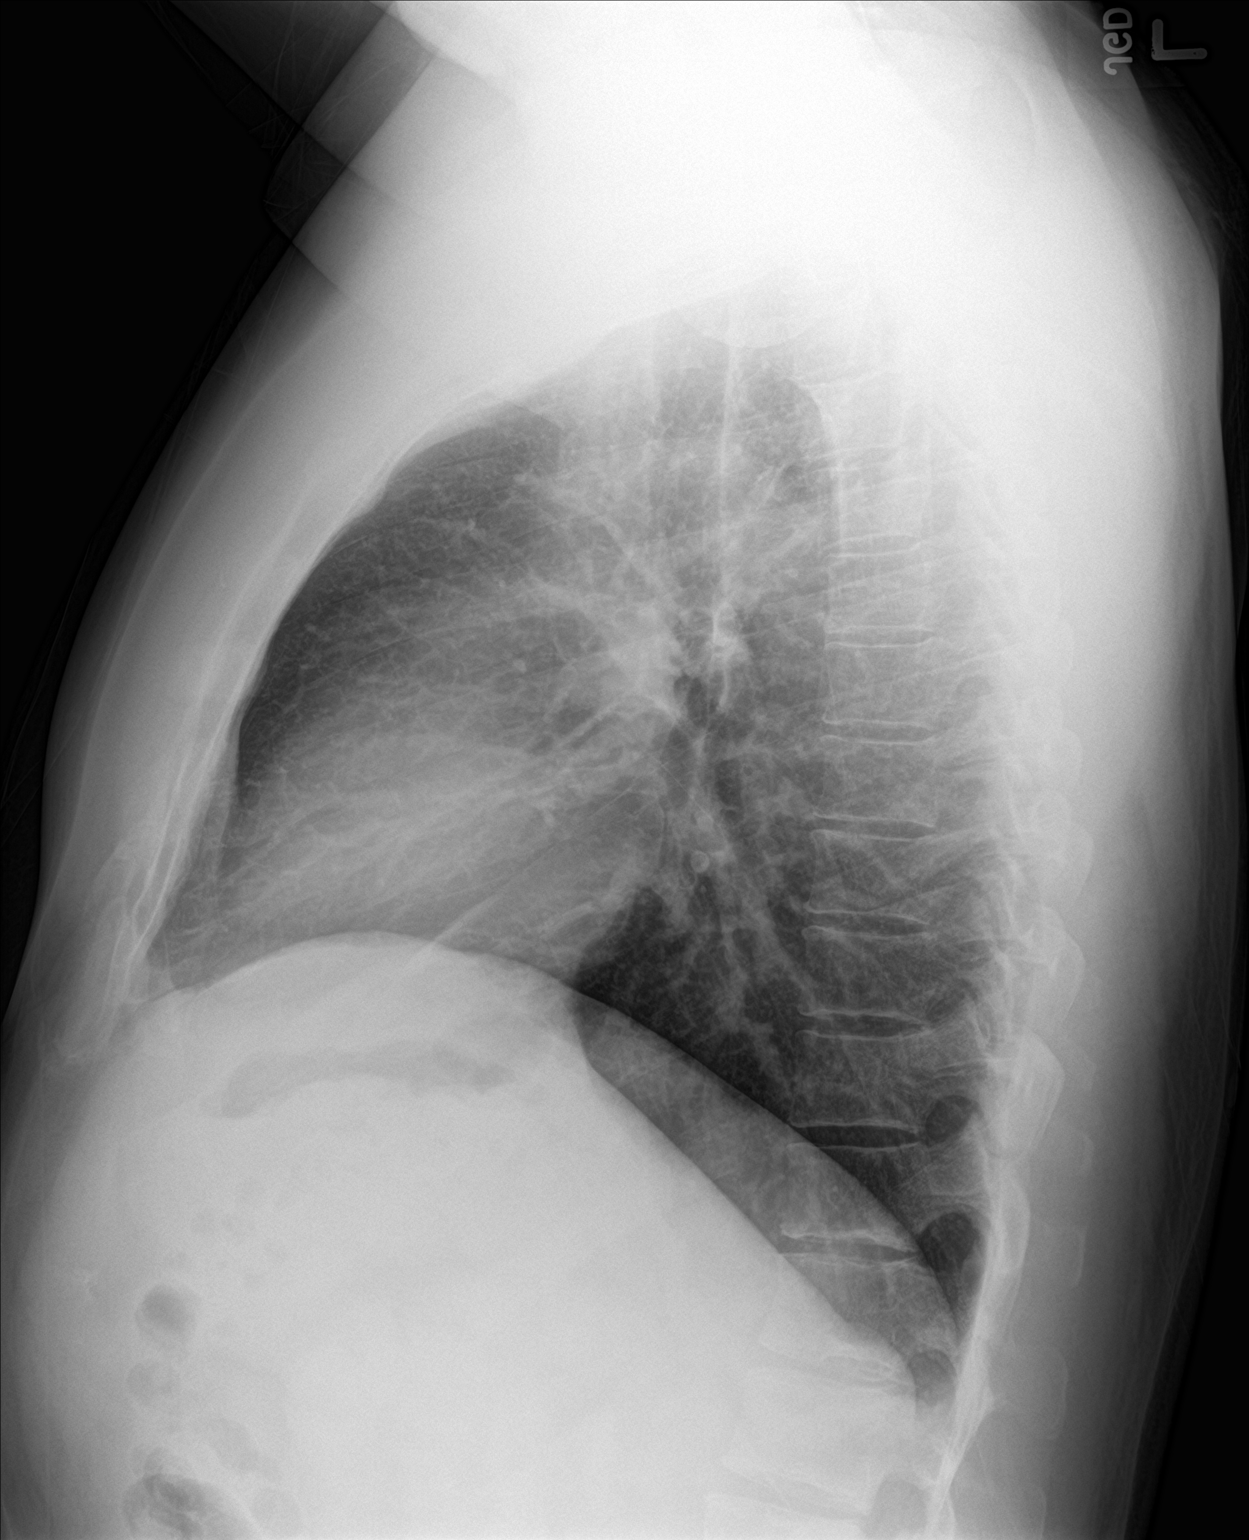

[2 of 2 positions shown; findings below may reference images not displayed]

FINDINGS: The heart size and mediastinal contours are within normal limits.
Both lungs are clear. The visualized skeletal structures are
unremarkable.
IMPRESSION: No active cardiopulmonary disease.
